# Patient Record
Sex: Male | Born: 1940 | Hispanic: Refuse to answer | Marital: Married | Smoking: Former smoker
Health system: Southern US, Community
[De-identification: ages and names within clinical notes are randomized; demographics above are authoritative.]

## PROBLEM LIST (undated history)

## (undated) DIAGNOSIS — I10 Essential (primary) hypertension: Secondary | ICD-10-CM

## (undated) DIAGNOSIS — E559 Vitamin D deficiency, unspecified: Secondary | ICD-10-CM

## (undated) DIAGNOSIS — I2510 Atherosclerotic heart disease of native coronary artery without angina pectoris: Secondary | ICD-10-CM

## (undated) DIAGNOSIS — N478 Other disorders of prepuce: Secondary | ICD-10-CM

## (undated) DIAGNOSIS — M24039 Loose body in unspecified wrist: Secondary | ICD-10-CM

## (undated) DIAGNOSIS — K4090 Unilateral inguinal hernia, without obstruction or gangrene, not specified as recurrent: Secondary | ICD-10-CM

## (undated) DIAGNOSIS — N471 Phimosis: Secondary | ICD-10-CM

## (undated) DIAGNOSIS — E785 Hyperlipidemia, unspecified: Secondary | ICD-10-CM

## (undated) DIAGNOSIS — E8881 Metabolic syndrome: Secondary | ICD-10-CM

## (undated) DIAGNOSIS — R0989 Other specified symptoms and signs involving the circulatory and respiratory systems: Secondary | ICD-10-CM

## (undated) DIAGNOSIS — E119 Type 2 diabetes mellitus without complications: Secondary | ICD-10-CM

## (undated) DIAGNOSIS — M1990 Unspecified osteoarthritis, unspecified site: Secondary | ICD-10-CM

## (undated) DIAGNOSIS — N400 Enlarged prostate without lower urinary tract symptoms: Secondary | ICD-10-CM

## (undated) DIAGNOSIS — M810 Age-related osteoporosis without current pathological fracture: Secondary | ICD-10-CM

## (undated) DIAGNOSIS — E291 Testicular hypofunction: Secondary | ICD-10-CM

## (undated) DIAGNOSIS — G8929 Other chronic pain: Secondary | ICD-10-CM

## (undated) DIAGNOSIS — D649 Anemia, unspecified: Secondary | ICD-10-CM

## (undated) HISTORY — DX: Other disorders of prepuce: N47.8

## (undated) HISTORY — DX: Phimosis: N47.1

## (undated) HISTORY — DX: Other specified symptoms and signs involving the circulatory and respiratory systems: R09.89

## (undated) HISTORY — DX: Loose body in unspecified wrist: M24.039

## (undated) HISTORY — DX: Type 2 diabetes mellitus without complications: E11.9

## (undated) HISTORY — DX: Vitamin D deficiency, unspecified: E55.9

## (undated) HISTORY — PX: HERNIA REPAIR: SHX51

## (undated) HISTORY — DX: Anemia, unspecified: D64.9

## (undated) HISTORY — DX: Hyperlipidemia, unspecified: E78.5

## (undated) HISTORY — DX: Metabolic syndrome: E88.81

## (undated) HISTORY — DX: Testicular hypofunction: E29.1

## (undated) HISTORY — DX: Unspecified osteoarthritis, unspecified site: M19.90

## (undated) HISTORY — DX: Essential (primary) hypertension: I10

## (undated) HISTORY — DX: Age-related osteoporosis without current pathological fracture: M81.0

## (undated) HISTORY — PX: CATARACT EXTRACTION, BILATERAL: SHX1313

## (undated) HISTORY — DX: Atherosclerotic heart disease of native coronary artery without angina pectoris: I25.10

## (undated) HISTORY — DX: Unilateral inguinal hernia, without obstruction or gangrene, not specified as recurrent: K40.90

## (undated) HISTORY — DX: Other chronic pain: G89.29

## (undated) HISTORY — DX: Benign prostatic hyperplasia without lower urinary tract symptoms: N40.0

## (undated) HISTORY — DX: Metabolic syndrome: E88.810

---

## 2009-02-18 HISTORY — PX: CIRCUMCISION: SUR203

## 2009-07-04 ENCOUNTER — Ambulatory Visit: Payer: Self-pay | Admitting: Urology

## 2009-07-07 ENCOUNTER — Ambulatory Visit: Payer: Self-pay | Admitting: Cardiovascular Disease

## 2009-07-19 ENCOUNTER — Ambulatory Visit: Payer: Self-pay | Admitting: Cardiology

## 2009-07-19 HISTORY — PX: CORONARY STENT PLACEMENT: SHX1402

## 2009-11-03 ENCOUNTER — Ambulatory Visit: Payer: Self-pay | Admitting: Urology

## 2009-11-14 ENCOUNTER — Ambulatory Visit: Payer: Self-pay | Admitting: Urology

## 2009-11-15 LAB — PATHOLOGY REPORT

## 2011-06-12 LAB — COMPREHENSIVE METABOLIC PANEL
Alkaline Phosphatase: 39 U/L
Anion Gap: 6 mmol/L (ref ?–30)
Calcium: 9.2 mg/dL
Cholesterol: 134 mg/dL (ref 0–200)
Potassium: 4.8 mmol/L
Sodium: 136 mmol/L — AB (ref 137–147)
Total Bilirubin: 0.2 mg/dL
Total Protein: 6.9 g/dL

## 2011-06-12 LAB — CBC WITH DIFFERENTIAL/PLATELET
Hemoglobin: 9.2 g/dL — AB (ref 13.5–17.5)
WBC: 5.6
platelet count: 333

## 2011-08-02 ENCOUNTER — Other Ambulatory Visit: Payer: Self-pay | Admitting: Internal Medicine

## 2011-08-02 ENCOUNTER — Encounter: Payer: Self-pay | Admitting: Urgent Care

## 2011-08-02 ENCOUNTER — Ambulatory Visit (INDEPENDENT_AMBULATORY_CARE_PROVIDER_SITE_OTHER): Payer: Medicare Other | Admitting: Urgent Care

## 2011-08-02 VITALS — BP 119/55 | HR 72 | Temp 97.4°F | Ht 70.0 in | Wt 193.0 lb

## 2011-08-02 DIAGNOSIS — R195 Other fecal abnormalities: Secondary | ICD-10-CM

## 2011-08-02 DIAGNOSIS — D649 Anemia, unspecified: Secondary | ICD-10-CM | POA: Insufficient documentation

## 2011-08-02 DIAGNOSIS — K921 Melena: Secondary | ICD-10-CM

## 2011-08-02 DIAGNOSIS — K219 Gastro-esophageal reflux disease without esophagitis: Secondary | ICD-10-CM

## 2011-08-02 MED ORDER — PEG 3350-KCL-NA BICARB-NACL 420 G PO SOLR: ORAL | Status: AC

## 2011-08-02 NOTE — Assessment & Plan Note (Signed)
Rare symptoms, responds to prn TUMs.  May need PPI,  Await EGD findings for anemia.

## 2011-08-02 NOTE — Assessment & Plan Note (Addendum)
Kent Porter is a pleasant 71 y.o. male w/ new diagnosis of normocytic anemia & scant hematochezia in setting of aspirin & plavix for CAD/stent.  Hgb 9.2.   Colonoscopy to look for source including colorectal ca, polyp, or benign source.  EGD at the same time due to GERD symptoms & anemia to r/o upper GI source such as PUD or gastritis.  We did discuss the fact that he is at a slightly higher risk of GI bleeding given the fact that he is on ASA/plavix, however the benefits of remaining on the meds outweigh the risk of bleeding. We discussed the life threatening nature of an embolic event. He agrees to remain on ASA/plavix for this procedure. He also understands that a second procedure may be required since he is on the meds if he shows signs of significant bleeding or is in need of significant intervention that cannot be performed while on ASA/plavix. Other risks discussed include reaction to the medication, perforation, and infection. He agrees with all the above and consent will be obtained.   Hold your iron for 1 week prior to the procedures Hold morning dose of metformin day of procedure until after procedure TO ER if you develop chest pain, bleeding, dizziness, heart racing or trouble breathing.

## 2011-08-02 NOTE — Patient Instructions (Addendum)
Colonoscopy & EGD (upper endoscopy) with Dr Jena Gauss Hold your iron for 1 week prior to the procedures Hold morning dose of metformin day of procedure until after procedure TO ER if you develop chest pain, bleeding, dizziness, heart racing or trouble breathing.

## 2011-08-02 NOTE — Assessment & Plan Note (Signed)
See anemia ?

## 2011-08-02 NOTE — Progress Notes (Signed)
Referring Provider: Dr. Sakeitha Crowder ([place], Fax:[phone]) Primary Care Physician: Dr Crowder Primary Gastroenterologist:  Dr. Rourk  Chief Complaint  Patient presents with  . Colonoscopy  . Anemia   HPI:  Kent Porter is a 70 y.o. male here as a referral from Dr. S. Crowder for anemia & colonoscopy.  Pt states he had fatigue & some SOBOE.  He has been sitting down a lot.  He went to his PCP & also had a stress test & everything was normal.  Dx w/ anemia.  Hgb 9.2, Hct 27.8.  MCV 79.4.  On ASA x 15 yrs & plavix x 3 years for CAD s/p stent. He has rare heartburn 3-4 times per mo, may take a rolaids if he takes anything.   Denies nausea, vomiting, dysphagia, odynophagia or anorexia.   Chronic constipation with all the meds he takes.  He has seen rare episodes of bright red blood in small amounts in commode w/ straining.  Weight stable.  Started iron 1 month ago.  No hx blood transfusion or anemia previously.  +easy bruising.  1 episode epistaxis 8 weeks ago for several hours.  No other NSAIDS.   WBC, platelets normal BUN 37, glucose 127, otherwise normal CMP  Past Medical History  Diagnosis Date  . HTN (hypertension)   . CAD (coronary artery disease)   . DM (diabetes mellitus)   . Hyperlipidemia   . OA (osteoarthritis)   . Inguinal hernia     left  . Redundant prepuce and phimosis   . DJD (degenerative joint disease)   . Dysmetabolic syndrome X   . Loose body in forearm joint   . Osteoporosis   . Hypogonadism male   . Carotid bruit   . Prostate hypertrophy   . Chronic pain   . Vitamin D deficiency disease   . Anemia     Past Surgical History  Procedure Date  . Cataract extraction, bilateral     bilat  . Hernia repair     bilat inguinal  . Coronary stent placement 07/19/2009    Dr Paraschos  . Circumcision 2011    Current Outpatient Prescriptions  Medication Sig Dispense Refill  . aspirin 81 MG tablet Take 81 mg by mouth daily.      . atorvastatin (LIPITOR) 40  MG tablet Take 40 mg by mouth daily.      . calcium carbonate 200 MG capsule Take 600 mg by mouth 3 (three) times daily.       . Cholecalciferol (VITAMIN D3) 1000 UNITS CAPS Take 1,000 Units by mouth 2 (two) times daily.       . clopidogrel (PLAVIX) 75 MG tablet Take 75 mg by mouth daily.      . docusate sodium (COLACE) 100 MG capsule Take 100 mg by mouth 3 (three) times daily as needed.       . DULoxetine (CYMBALTA) 60 MG capsule Take 60 mg by mouth daily.      . fenofibrate 160 MG tablet Take 160 mg by mouth daily.      . Ferrous Sulfate (IRON) 325 (65 FE) MG TABS Take 1 tablet by mouth 2 (two) times daily.      . lisinopril (PRINIVIL,ZESTRIL) 5 MG tablet Take 5 mg by mouth daily.      . metFORMIN (GLUCOPHAGE) 500 MG tablet Take 500 mg by mouth daily with breakfast.      . temazepam (RESTORIL) 15 MG capsule Take 15 mg by mouth at bedtime as needed.      .   polyethylene glycol-electrolytes (TRILYTE) 420 G solution Use as directed Also buy 1 fleet enema & 4 dulcolax tablets to use as directed  4000 mL  0    Allergies as of 08/02/2011  . (No Known Allergies)    Family History  Problem Relation Age of Onset  . Prostate cancer Father   . Alzheimer's disease Mother     History   Social History  . Marital Status: Married    Spouse Name: N/A    Number of Children: 2  . Years of Education: N/A   Occupational History  . Saw mill mechanic    Social History Main Topics  . Smoking status: Former Smoker -- 1.0 packs/day    Types: Cigarettes    Quit date: 08/01/1981  . Smokeless tobacco: Not on file   Comment: quit about 30 yrs ago  . Alcohol Use: Yes     beer couple times per yr  . Drug Use: No  . Sexually Active: Not on file   Other Topics Concern  . Not on file   Social History Narrative   Lives w/ wife    Review of Systems: Gen: Denies any fever, chills, sweats, anorexia, fatigue, weakness, malaise, weight loss, and sleep disorder CV: Denies chest pain, angina,  palpitations, syncope, orthopnea, PND, peripheral edema, and claudication. Resp: Denies dyspnea at rest, dyspnea with exercise, cough, sputum, wheezing, coughing up blood, and pleurisy. GI: Denies vomiting blood, jaundice, and fecal incontinence. GU : Denies urinary burning, blood in urine, urinary frequency, urinary hesitancy, nocturnal urination, and urinary incontinence. MS: Denies joint pain, limitation of movement, and swelling, stiffness, low back pain, extremity pain. Denies muscle weakness, cramps, atrophy.  Derm: Denies rash, itching, dry skin, hives, moles, warts, or unhealing ulcers.  Psych: Denies depression, anxiety, memory loss, suicidal ideation, hallucinations, paranoia, and confusion. Heme: Denies enlarged lymph nodes. Neuro:  Denies any headaches, dizziness, paresthesias. Endo:  Denies any problems with DM, thyroid, adrenal function.  Physical Exam: BP 119/55  Pulse 72  Temp 97.4 F (36.3 C) (Temporal)  Ht 5' 10" (1.778 m)  Wt 193 lb (87.544 kg)  BMI 27.69 kg/m2 General:   Alert,  Well-developed, well-nourished, pleasant and cooperative in NAD Head:  Normocephalic and atraumatic. Eyes:  Sclera clear, no icterus.   Conjunctiva pink. Ears:  Normal auditory acuity. Nose:  No deformity, discharge, or lesions. Mouth:  No deformity or lesions,oropharynx pink & moist. Neck:  Supple; no masses or thyromegaly. Lungs:  Clear throughout to auscultation.   No wheezes, crackles, or rhonchi. No acute distress. Heart:  Regular rate and rhythm; no murmurs, clicks, rubs,  or gallops. Abdomen:  Normal bowel sounds.  No bruits.  Soft, non-tender and non-distended without masses, hepatosplenomegaly or hernias noted.  No guarding or rebound tenderness.   Rectal:  Deferred. Msk:  Symmetrical without gross deformities. Normal posture. Pulses:  Normal pulses noted. Extremities:  No clubbing or edema. Neurologic:  Alert and oriented x4;  grossly normal neurologically. Skin:  Intact without  significant lesions or rashes. Lymph Nodes:  No significant cervical adenopathy. Psych:  Alert and cooperative. Normal mood and affect.  

## 2011-08-05 NOTE — Progress Notes (Signed)
Quick Note:  Reviewed at OV ______ 

## 2011-08-08 NOTE — Progress Notes (Signed)
No PCP on file 

## 2011-08-09 MED ORDER — SODIUM CHLORIDE 0.45 % IV SOLN
Freq: Once | INTRAVENOUS | Status: AC
  Administered 2011-08-12: 13:00:00 via INTRAVENOUS

## 2011-08-12 ENCOUNTER — Encounter (HOSPITAL_COMMUNITY)
Admission: RE | Disposition: A | Discharge status: Home / Self Care | Payer: Self-pay | Source: Ambulatory Visit | Attending: Internal Medicine

## 2011-08-12 ENCOUNTER — Ambulatory Visit (HOSPITAL_COMMUNITY)
Admission: RE | Admit: 2011-08-12 | Discharge: 2011-08-12 | Disposition: A | Discharge status: Home / Self Care | Payer: Medicare Other | Source: Ambulatory Visit | Attending: Internal Medicine | Admitting: Internal Medicine

## 2011-08-12 ENCOUNTER — Encounter (HOSPITAL_COMMUNITY): Payer: Self-pay

## 2011-08-12 DIAGNOSIS — K2960 Other gastritis without bleeding: Secondary | ICD-10-CM

## 2011-08-12 DIAGNOSIS — E785 Hyperlipidemia, unspecified: Secondary | ICD-10-CM | POA: Insufficient documentation

## 2011-08-12 DIAGNOSIS — Z79899 Other long term (current) drug therapy: Secondary | ICD-10-CM | POA: Insufficient documentation

## 2011-08-12 DIAGNOSIS — D126 Benign neoplasm of colon, unspecified: Secondary | ICD-10-CM | POA: Insufficient documentation

## 2011-08-12 DIAGNOSIS — K2100 Gastro-esophageal reflux disease with esophagitis, without bleeding: Secondary | ICD-10-CM | POA: Insufficient documentation

## 2011-08-12 DIAGNOSIS — D509 Iron deficiency anemia, unspecified: Secondary | ICD-10-CM

## 2011-08-12 DIAGNOSIS — E119 Type 2 diabetes mellitus without complications: Secondary | ICD-10-CM | POA: Insufficient documentation

## 2011-08-12 DIAGNOSIS — K5730 Diverticulosis of large intestine without perforation or abscess without bleeding: Secondary | ICD-10-CM

## 2011-08-12 DIAGNOSIS — R195 Other fecal abnormalities: Secondary | ICD-10-CM

## 2011-08-12 DIAGNOSIS — K921 Melena: Secondary | ICD-10-CM | POA: Insufficient documentation

## 2011-08-12 DIAGNOSIS — K644 Residual hemorrhoidal skin tags: Secondary | ICD-10-CM | POA: Insufficient documentation

## 2011-08-12 DIAGNOSIS — I10 Essential (primary) hypertension: Secondary | ICD-10-CM | POA: Insufficient documentation

## 2011-08-12 DIAGNOSIS — D649 Anemia, unspecified: Secondary | ICD-10-CM

## 2011-08-12 DIAGNOSIS — Z01812 Encounter for preprocedural laboratory examination: Secondary | ICD-10-CM | POA: Insufficient documentation

## 2011-08-12 SURGERY — COLONOSCOPY WITH ESOPHAGOGASTRODUODENOSCOPY (EGD)
Anesthesia: Moderate Sedation

## 2011-08-12 MED ORDER — MIDAZOLAM HCL 5 MG/5ML IJ SOLN
INTRAMUSCULAR | Status: AC
  Filled 2011-08-12: qty 10

## 2011-08-12 MED ORDER — BUTAMBEN-TETRACAINE-BENZOCAINE 2-2-14 % EX AERO
INHALATION_SPRAY | CUTANEOUS | Status: DC | PRN
  Administered 2011-08-12: 2 via TOPICAL

## 2011-08-12 MED ORDER — STERILE WATER FOR IRRIGATION IR SOLN
Status: DC | PRN
  Administered 2011-08-12: 14:00:00

## 2011-08-12 MED ORDER — MEPERIDINE HCL 100 MG/ML IJ SOLN
INTRAMUSCULAR | Status: DC | PRN
  Administered 2011-08-12 (×2): 25 mg via INTRAVENOUS
  Administered 2011-08-12: 50 mg via INTRAVENOUS

## 2011-08-12 MED ORDER — MEPERIDINE HCL 100 MG/ML IJ SOLN
INTRAMUSCULAR | Status: AC
  Filled 2011-08-12: qty 2

## 2011-08-12 MED ORDER — MIDAZOLAM HCL 5 MG/5ML IJ SOLN
INTRAMUSCULAR | Status: DC | PRN
  Administered 2011-08-12: 1 mg via INTRAVENOUS
  Administered 2011-08-12: 2 mg via INTRAVENOUS
  Administered 2011-08-12 (×2): 1 mg via INTRAVENOUS

## 2011-08-12 NOTE — Discharge Instructions (Addendum)
EGD Discharge instructions Please read the instructions outlined below and refer to this sheet in the next few weeks. These discharge instructions provide you with general information on caring for yourself after you leave the hospital. Your doctor may also give you specific instructions. While your treatment has been planned according to the most current medical practices available, unavoidable complications occasionally occur. If you have any problems or questions after discharge, please call your doctor. ACTIVITY  You may resume your regular activity but move at a slower pace for the next 24 hours.   Take frequent rest periods for the next 24 hours.   Walking will help expel (get rid of) the air and reduce the bloated feeling in your abdomen.   No driving for 24 hours (because of the anesthesia (medicine) used during the test).   You may shower.   Do not sign any important legal documents or operate any machinery for 24 hours (because of the anesthesia used during the test).  NUTRITION  Drink plenty of fluids.   You may resume your normal diet.   Begin with a light meal and progress to your normal diet.   Avoid alcoholic beverages for 24 hours or as instructed by your caregiver.  MEDICATIONS  You may resume your normal medications unless your caregiver tells you otherwise.  WHAT YOU CAN EXPECT TODAY  You may experience abdominal discomfort such as a feeling of fullness or "gas" pains.  FOLLOW-UP  Your doctor will discuss the results of your test with you.  SEEK IMMEDIATE MEDICAL ATTENTION IF ANY OF THE FOLLOWING OCCUR:  Excessive nausea (feeling sick to your stomach) and/or vomiting.   Severe abdominal pain and distention (swelling).   Trouble swallowing.   Temperature over 101 F (37.8 C).   Rectal bleeding or vomiting of blood.   Colonoscopy Discharge Instructions  Read the instructions outlined below and refer to this sheet in the next few weeks. These  discharge instructions provide you with general information on caring for yourself after you leave the hospital. Your doctor may also give you specific instructions. While your treatment has been planned according to the most current medical practices available, unavoidable complications occasionally occur. If you have any problems or questions after discharge, call Dr. Gala Romney at [phone]. ACTIVITY  You may resume your regular activity, but move at a slower pace for the next 24 hours.   Take frequent rest periods for the next 24 hours.   Walking will help get rid of the air and reduce the bloated feeling in your belly (abdomen).   No driving for 24 hours (because of the medicine (anesthesia) used during the test).    Do not sign any important legal documents or operate any machinery for 24 hours (because of the anesthesia used during the test).  NUTRITION  Drink plenty of fluids.   You may resume your normal diet as instructed by your doctor.   Begin with a light meal and progress to your normal diet. Heavy or fried foods are harder to digest and may make you feel sick to your stomach (nauseated).   Avoid alcoholic beverages for 24 hours or as instructed.  MEDICATIONS  You may resume your normal medications unless your doctor tells you otherwise.  WHAT YOU CAN EXPECT TODAY  Some feelings of bloating in the abdomen.   Passage of more gas than usual.   Spotting of blood in your stool or on the toilet paper.  IF YOU HAD POLYPS REMOVED DURING THE COLONOSCOPY:  No aspirin products for 7 days or as instructed.   No alcohol for 7 days or as instructed.   Eat a soft diet for the next 24 hours.  FINDING OUT THE RESULTS OF YOUR TEST Not all test results are available during your visit. If your test results are not back during the visit, make an appointment with your caregiver to find out the results. Do not assume everything is normal if you have not heard from your caregiver or the  medical facility. It is important for you to follow up on all of your test results.  SEEK IMMEDIATE MEDICAL ATTENTION IF:  You have more than a spotting of blood in your stool.   Your belly is swollen (abdominal distention).   You are nauseated or vomiting.   You have a temperature over 101.   You have abdominal pain or discomfort that is severe or gets worse throughout the day.   Polyp,  diverticulosis and GERD information provided  Begin Protonix 40 mg orally daily. Prescription provided.  Further recommendations to follow pending review of pathology report.Colon Polyps A polyp is extra tissue that grows inside your body. Colon polyps grow in the large intestine. The large intestine, also called the colon, is part of your digestive system. It is a long, hollow tube at the end of your digestive tract where your body makes and stores stool. Most polyps are not dangerous. They are benign. This means they are not cancerous. But over time, some types of polyps can turn into cancer. Polyps that are smaller than a pea are usually not harmful. But larger polyps could someday become or may already be cancerous. To be safe, doctors remove all polyps and test them.  WHO GETS POLYPS? Anyone can get polyps, but certain people are more likely than others. You may have a greater chance of getting polyps if:  You are over 50.   You have had polyps before.   Someone in your family has had polyps.   Someone in your family has had cancer of the large intestine.   Find out if someone in your family has had polyps. You may also be more likely to get polyps if you:   Eat a lot of fatty foods.   Smoke.   Drink alcohol.   Do not exercise.   Eat too much.  SYMPTOMS  Most small polyps do not cause symptoms. People often do not know they have one until their caregiver finds it during a regular checkup or while testing them for something else. Some people do have symptoms like these:  Bleeding  from the anus. You might notice blood on your underwear or on toilet paper after you have had a bowel movement.   Constipation or diarrhea that lasts more than a week.   Blood in the stool. Blood can make stool look black or it can show up as red streaks in the stool.  If you have any of these symptoms, see your caregiver. HOW DOES THE DOCTOR TEST FOR POLYPS? The doctor can use four tests to check for polyps:  Digital rectal exam. The caregiver wears gloves and checks your rectum (the last part of the large intestine) to see if it feels normal. This test would find polyps only in the rectum. Your caregiver may need to do one of the other tests listed below to find polyps higher up in the intestine.   Barium enema. The caregiver puts a liquid called barium into your rectum before  taking x-rays of your large intestine. Barium makes your intestine look white in the pictures. Polyps are dark, so they are easy to see.   Sigmoidoscopy. With this test, the caregiver can see inside your large intestine. A thin flexible tube is placed into your rectum. The device is called a sigmoidoscope, which has a light and a tiny video camera in it. The caregiver uses the sigmoidoscope to look at the last third of your large intestine.   Colonoscopy. This test is like sigmoidoscopy, but the caregiver looks at all of the large intestine. It usually requires sedation. This is the most common method for finding and removing polyps.  TREATMENT   The caregiver will remove the polyp during sigmoidoscopy or colonoscopy. The polyp is then tested for cancer.   If you have had polyps, your caregiver may want you to get tested regularly in the future.  PREVENTION  There is not one sure way to prevent polyps. You might be able to lower your risk of getting them if you:  Eat more fruits and vegetables and less fatty food.   Do not smoke.   Avoid alcohol.   Exercise every day.   Lose weight if you are overweight.    Eating more calcium and folate can also lower your risk of getting polyps. Some foods that are rich in calcium are milk, cheese, and broccoli. Some foods that are rich in folate are chickpeas, kidney beans, and spinach.   Aspirin might help prevent polyps. Studies are under way.    Diverticulitis A diverticulum is a small pouch or sac on the colon. Diverticulosis is the presence of these diverticula on the colon. Diverticulitis is the irritation (inflammation) or infection of diverticula. CAUSES  The colon and its diverticula contain bacteria. If food particles block the tiny opening to a diverticulum, the bacteria inside can grow and cause an increase in pressure. This leads to infection and inflammation and is called diverticulitis. SYMPTOMS   Abdominal pain and tenderness. Usually, the pain is located on the left side of your abdomen. However, it could be located elsewhere.   Fever.   Bloating.   Feeling sick to your stomach (nausea).   Throwing up (vomiting).   Abnormal stools.  DIAGNOSIS  Your caregiver will take a history and perform a physical exam. Since many things can cause abdominal pain, other tests may be necessary. Tests may include:  Blood tests.   Urine tests.   X-ray of the abdomen.   CT scan of the abdomen.  Sometimes, surgery is needed to determine if diverticulitis or other conditions are causing your symptoms. TREATMENT  Most of the time, you can be treated without surgery. Treatment includes:  Resting the bowels by only having liquids for a few days. As you improve, you will need to eat a low-fiber diet.   Intravenous (IV) fluids if you are losing body fluids (dehydrated).   Antibiotic medicines that treat infections may be given.   Pain and nausea medicine, if needed.   Surgery if the inflamed diverticulum has burst.  HOME CARE INSTRUCTIONS   Try a clear liquid diet (broth, tea, or water for as long as directed by your caregiver). You may then  gradually begin a low-fiber diet as tolerated. A low-fiber diet is a diet with less than 10 grams of fiber. Choose the foods below to reduce fiber in the diet:   White breads, cereals, rice, and pasta.   Cooked fruits and vegetables or soft fresh fruits and  vegetables without the skin.   Ground or well-cooked tender beef, ham, veal, lamb, pork, or poultry.   Eggs and seafood.   After your diverticulitis symptoms have improved, your caregiver may put you on a high-fiber diet. A high-fiber diet includes 14 grams of fiber for every 1000 calories consumed. For a standard 2000 calorie diet, you would need 28 grams of fiber. Follow these diet guidelines to help you increase the fiber in your diet. It is important to slowly increase the amount fiber in your diet to avoid gas, constipation, and bloating.   Choose whole-grain breads, cereals, pasta, and brown rice.   Choose fresh fruits and vegetables with the skin on. Do not overcook vegetables because the more vegetables are cooked, the more fiber is lost.   Choose more nuts, seeds, legumes, dried peas, beans, and lentils.   Look for food products that have greater than 3 grams of fiber per serving on the Nutrition Facts label.   Take all medicine as directed by your caregiver.   If your caregiver has given you a follow-up appointment, it is very important that you go. Not going could result in lasting (chronic) or permanent injury, pain, and disability. If there is any problem keeping the appointment, call to reschedule.  SEEK MEDICAL CARE IF:   Your pain does not improve.   You have a hard time advancing your diet beyond clear liquids.   Your bowel movements do not return to normal.  SEEK IMMEDIATE MEDICAL CARE IF:   Your pain becomes worse.   You have an oral temperature above 102 F (38.9 C), not controlled by medicine.   You have repeated vomiting.   You have bloody or black, tarry stools.   Symptoms that brought you to your  caregiver become worse or are not getting better.  MAKE SURE YOU:   Understand these instructions.   Will watch your condition.   Will get help right away if you are not doing well or get worse.  Document Released: 11/14/2004 Document Revised: 01/24/2011 Document Reviewed: 03/12/2010 [facility] Patient Information 2012 [place], [place].  Gastroesophageal Reflux Disease, Adult Gastroesophageal reflux disease (GERD) happens when acid from your stomach flows up into the esophagus. When acid comes in contact with the esophagus, the acid causes soreness (inflammation) in the esophagus. Over time, GERD may create small holes (ulcers) in the lining of the esophagus. CAUSES   Increased body weight. This puts pressure on the stomach, making acid rise from the stomach into the esophagus.   Smoking. This increases acid production in the stomach.   Drinking alcohol. This causes decreased pressure in the lower esophageal sphincter (valve or ring of muscle between the esophagus and stomach), allowing acid from the stomach into the esophagus.   Late evening meals and a full stomach. This increases pressure and acid production in the stomach.   A malformed lower esophageal sphincter.  Sometimes, no cause is found. SYMPTOMS   Burning pain in the lower part of the mid-chest behind the breastbone and in the mid-stomach area. This may occur twice a week or more often.   Trouble swallowing.   Sore throat.   Dry cough.   Asthma-like symptoms including chest tightness, shortness of breath, or wheezing.  DIAGNOSIS  Your caregiver may be able to diagnose GERD based on your symptoms. In some cases, X-rays and other tests may be done to check for complications or to check the condition of your stomach and esophagus. TREATMENT  Your caregiver may recommend  over-the-counter or prescription medicines to help decrease acid production. Ask your caregiver before starting or adding any new medicines.  HOME CARE  INSTRUCTIONS   Change the factors that you can control. Ask your caregiver for guidance concerning weight loss, quitting smoking, and alcohol consumption.   Avoid foods and drinks that make your symptoms worse, such as:   Caffeine or alcoholic drinks.   Chocolate.   Peppermint or mint flavorings.   Garlic and onions.   Spicy foods.   Citrus fruits, such as oranges, lemons, or limes.   Tomato-based foods such as sauce, chili, salsa, and pizza.   Fried and fatty foods.   Avoid lying down for the 3 hours prior to your bedtime or prior to taking a nap.   Eat small, frequent meals instead of large meals.   Wear loose-fitting clothing. Do not wear anything tight around your waist that causes pressure on your stomach.   Raise the head of your bed 6 to 8 inches with wood blocks to help you sleep. Extra pillows will not help.   Only take over-the-counter or prescription medicines for pain, discomfort, or fever as directed by your caregiver.   Do not take aspirin, ibuprofen, or other nonsteroidal anti-inflammatory drugs (NSAIDs).  SEEK IMMEDIATE MEDICAL CARE IF:   You have pain in your arms, neck, jaw, teeth, or back.   Your pain increases or changes in intensity or duration.   You develop nausea, vomiting, or sweating (diaphoresis).   You develop shortness of breath, or you faint.   Your vomit is green, yellow, black, or looks like coffee grounds or blood.   Your stool is red, bloody, or black.  These symptoms could be signs of other problems, such as heart disease, gastric bleeding, or esophageal bleeding. MAKE SURE YOU:   Understand these instructions.   Will watch your condition.   Will get help right away if you are not doing well or get worse.  Document Released: 11/14/2004 Document Revised: 01/24/2011 Document Reviewed: 08/24/2010 [facility] Patient Information 2012 [place], [place].

## 2011-08-12 NOTE — Op Note (Signed)
[facility] [address] [place], [place]  [address]  COLONOSCOPY PROCEDURE REPORT  PATIENT:  Kent Porter, Kent Porter  MR#:  [id number] BIRTHDATE:  Aug 31, 1940, 70 yrs. old  GENDER:  male ENDOSCOPIST:  R. Roetta Sessions, MD FACP [facility] REF. BY:          Dr. Dessie Coma PROCEDURE DATE:  08/12/2011 PROCEDURE:  Ileocolonoscopy with snare polypectomy  INDICATIONS:  Hemoccult-positive stool iron deficiency anemia. See EGD report.  INFORMED CONSENT:  The risks, benefits, alternatives and imponderables including but not limited to bleeding, perforation as well as the possibility of a missed lesion have been reviewed. The potential for biopsy, lesion removal, etc. have also been discussed.  Questions have been answered.  All parties agreeable. Please see the history and physical in the medical record for more information.  MEDICATIONS:  Versed 5 mg IV and Demerol 100 mg IV in divided doses.  DESCRIPTION OF PROCEDURE:  After a digital rectal exam was performed, the EG-2990i ([id number]) and EC-[id number] ([id number]) colonoscope was advanced from the anus through the rectum and colon to the area of the cecum, ileocecal valve and appendiceal orifice.  The cecum was deeply intubated.  These structures were well-seen and photographed for the record.  From the level of the cecum and ileocecal valve, the scope was slowly and cautiously withdrawn.  The mucosal surfaces were carefully surveyed utilizing scope tip deflection to facilitate fold flattening as needed.  The scope was pulled down into the rectum where a thorough examination including retroflexion was performed. <<PROCEDUREIMAGES>>  FINDINGS:  Couple of small external hemorrhoids; otherwise normal rectum. Somewhat elongated tortuous colon. Scattered pancolonic diverticulosis. A single 6 mm polyp at the splenic flexure; or myoclonic as per normal. Normal distal 10 cm of terminal ileal mucosa.  THERAPEUTIC / DIAGNOSTIC MANEUVERS PERFORMED:  The polyp at splenic flexure was hot snare removed.  COMPLICATIONS:  None  CECAL WITHDRAWAL TIME:   11 minutes  IMPRESSION:   External hemorrhoids. Pancolonic diverticulosis. Colonic polyp-removed as described above. Findings at colonoscopy today                   would  not explain this degree of anemia via a mechanism of GI bleeding. See EGD report.  RECOMMENDATIONS:    Followup of pathology. Patient may need capsule imaging of the small bowel in the near future.  ______________________________ R. Roetta Sessions, MD Caleen Essex  CC:  n. eSIGNED:   R. Roetta Sessions at 08/12/2011 03:08 PM  Cherylynn Ridges, [id number]

## 2011-08-12 NOTE — H&P (View-Only) (Signed)
Referring Provider: Dr. Dessie Coma [facility], Fax:[phone]) Primary Care Physician: Dr Jaci Lazier Primary Gastroenterologist:  Dr. Jena Gauss  Chief Complaint  Patient presents with  . Colonoscopy  . Anemia   HPI:  Kent Porter is a 71 y.o. male here as a referral from Dr. Jethro Bastos for anemia & colonoscopy.  Pt states he had fatigue & some SOBOE.  He has been sitting down a lot.  He went to his PCP & also had a stress test & everything was normal.  Dx w/ anemia.  Hgb 9.2, Hct 27.8.  MCV 79.4.  On ASA x 15 yrs & plavix x 3 years for CAD s/p stent. He has rare heartburn 3-4 times per mo, may take a rolaids if he takes anything.   Denies nausea, vomiting, dysphagia, odynophagia or anorexia.   Chronic constipation with all the meds he takes.  He has seen rare episodes of bright red blood in small amounts in commode w/ straining.  Weight stable.  Started iron 1 month ago.  No hx blood transfusion or anemia previously.  +easy bruising.  1 episode epistaxis 8 weeks ago for several hours.  No other NSAIDS.   WBC, platelets normal BUN 37, glucose 127, otherwise normal CMP  Past Medical History  Diagnosis Date  . HTN (hypertension)   . CAD (coronary artery disease)   . DM (diabetes mellitus)   . Hyperlipidemia   . OA (osteoarthritis)   . Inguinal hernia     left  . Redundant prepuce and phimosis   . DJD (degenerative joint disease)   . Dysmetabolic syndrome X   . Loose body in forearm joint   . Osteoporosis   . Hypogonadism male   . Carotid bruit   . Prostate hypertrophy   . Chronic pain   . Vitamin D deficiency disease   . Anemia     Past Surgical History  Procedure Date  . Cataract extraction, bilateral     bilat  . Hernia repair     bilat inguinal  . Coronary stent placement 07/19/2009    Dr Darrold Junker  . Circumcision 2011    Current Outpatient Prescriptions  Medication Sig Dispense Refill  . aspirin 81 MG tablet Take 81 mg by mouth daily.      Marland Kitchen atorvastatin (LIPITOR) 40  MG tablet Take 40 mg by mouth daily.      . calcium carbonate 200 MG capsule Take 600 mg by mouth 3 (three) times daily.       . Cholecalciferol (VITAMIN D3) 1000 UNITS CAPS Take 1,000 Units by mouth 2 (two) times daily.       . clopidogrel (PLAVIX) 75 MG tablet Take 75 mg by mouth daily.      Marland Kitchen docusate sodium (COLACE) 100 MG capsule Take 100 mg by mouth 3 (three) times daily as needed.       . DULoxetine (CYMBALTA) 60 MG capsule Take 60 mg by mouth daily.      . fenofibrate 160 MG tablet Take 160 mg by mouth daily.      . Ferrous Sulfate (IRON) 325 (65 FE) MG TABS Take 1 tablet by mouth 2 (two) times daily.      Marland Kitchen lisinopril (PRINIVIL,ZESTRIL) 5 MG tablet Take 5 mg by mouth daily.      . metFORMIN (GLUCOPHAGE) 500 MG tablet Take 500 mg by mouth daily with breakfast.      . temazepam (RESTORIL) 15 MG capsule Take 15 mg by mouth at bedtime as needed.      Marland Kitchen  polyethylene glycol-electrolytes (TRILYTE) 420 G solution Use as directed Also buy 1 fleet enema & 4 dulcolax tablets to use as directed  4000 mL  0    Allergies as of 08/02/2011  . (No Known Allergies)    Family History  Problem Relation Age of Onset  . Prostate cancer Father   . Alzheimer's disease Mother     History   Social History  . Marital Status: Married    Spouse Name: N/A    Number of Children: 2  . Years of Education: N/A   Occupational History  . [employer] adviser    Social History Main Topics  . Smoking status: Former Smoker -- 1.0 packs/day    Types: Cigarettes    Quit date: 08/01/1981  . Smokeless tobacco: Not on file   Comment: quit about 30 yrs ago  . Alcohol Use: Yes     beer couple times per yr  . Drug Use: No  . Sexually Active: Not on file   Other Topics Concern  . Not on file   Social History Narrative   Lives w/ wife    Review of Systems: Gen: Denies any fever, chills, sweats, anorexia, fatigue, weakness, malaise, weight loss, and sleep disorder CV: Denies chest pain, angina,  palpitations, syncope, orthopnea, PND, peripheral edema, and claudication. Resp: Denies dyspnea at rest, dyspnea with exercise, cough, sputum, wheezing, coughing up blood, and pleurisy. GI: Denies vomiting blood, jaundice, and fecal incontinence. GU : Denies urinary burning, blood in urine, urinary frequency, urinary hesitancy, nocturnal urination, and urinary incontinence. MS: Denies joint pain, limitation of movement, and swelling, stiffness, low back pain, extremity pain. Denies muscle weakness, cramps, atrophy.  Derm: Denies rash, itching, dry skin, hives, moles, warts, or unhealing ulcers.  Psych: Denies depression, anxiety, memory loss, suicidal ideation, hallucinations, paranoia, and confusion. Heme: Denies enlarged lymph nodes. Neuro:  Denies any headaches, dizziness, paresthesias. Endo:  Denies any problems with DM, thyroid, adrenal function.  Physical Exam: BP 119/55  Pulse 72  Temp 97.4 F (36.3 C) (Temporal)  Ht 5\' 10"  (1.778 m)  Wt 193 lb (87.544 kg)  BMI 27.69 kg/m2 General:   Alert,  Well-developed, well-nourished, pleasant and cooperative in NAD Head:  Normocephalic and atraumatic. Eyes:  Sclera clear, no icterus.   Conjunctiva pink. Ears:  Normal auditory acuity. Nose:  No deformity, discharge, or lesions. Mouth:  No deformity or lesions,oropharynx pink & moist. Neck:  Supple; no masses or thyromegaly. Lungs:  Clear throughout to auscultation.   No wheezes, crackles, or rhonchi. No acute distress. Heart:  Regular rate and rhythm; no murmurs, clicks, rubs,  or gallops. Abdomen:  Normal bowel sounds.  No bruits.  Soft, non-tender and non-distended without masses, hepatosplenomegaly or hernias noted.  No guarding or rebound tenderness.   Rectal:  Deferred. Msk:  Symmetrical without gross deformities. Normal posture. Pulses:  Normal pulses noted. Extremities:  No clubbing or edema. Neurologic:  Alert and oriented x4;  grossly normal neurologically. Skin:  Intact without  significant lesions or rashes. Lymph Nodes:  No significant cervical adenopathy. Psych:  Alert and cooperative. Normal mood and affect.

## 2011-08-12 NOTE — Op Note (Signed)
[facility] [address] [place], [place]  [address]  ENDOSCOPY PROCEDURE REPORT  PATIENT:  Kent Porter, Kent Porter  MR#:  [id number] BIRTHDATE:  October 19, 1940, 70 yrs. old  GENDER:  male  ENDOSCOPIST:  R. Roetta Sessions, MD FACP [facility] Referred by:          Dr. Dessie Coma  PROCEDURE DATE:  08/12/2011 PROCEDURE:  EGD with gastric biopsy  INDICATIONS:   frequent GERD; iron deficiency anemia.  INFORMED CONSENT:   The risks, benefits, limitations, alternatives and imponderables have been discussed.  The potential for biopsy, esophogeal dilation, etc. have also been reviewed.  Questions have been answered.  All parties agreeable.  Please see the history and physical in the medical record for more information.  MEDICATIONS:     Versed 4 mg IV and Demerol 50 mg IV in divided doses. Cetacaine spray.  DESCRIPTION OF PROCEDURE:   The EG-2990i ([id number]) endoscope was introduced through the mouth and advanced to the second portion of the duodenum without difficulty or limitations.  The mucosal surfaces were surveyed very carefully during advancement of the scope and upon withdrawal.  Retroflexion view of the proximal stomach and esophagogastric junction was performed.  <<PROCEDUREIMAGES>>  FINDINGS:    4 quadrant distal esophageal erosions within 5 mm of the GE junction. No Barrett's esophagus. Somewhat patulous EG junction. Stomach empty. Small hiatal hernia. Numerous antral and body erosions. No ulcer or infiltrating process. Pylorus     patent. Bulbar erosions; otherwise the first and second portion of the duodenum appeared normal.  THERAPEUTIC / DIAGNOSTIC MANEUVERS PERFORMED:   Biopsies of the gastric because taken for histologic study  COMPLICATIONS:   None  IMPRESSION: Erosive reflux esophagitis. Small hilar hernia. Gastric and duodenal bulbar erosions-status gastric post biopsy  RECOMMENDATIONS:    Follow up on pathology. Begin a Protonix. 40 mg orally daily. GERD  information provided. See colonoscopy report.  ______________________________ R. Roetta Sessions, MD Caleen Essex  CC:  n. eSIGNED:   R. Roetta Sessions at 08/12/2011 02:40 PM  Cherylynn Ridges, [id number]

## 2011-08-12 NOTE — Interval H&P Note (Signed)
History and Physical Interval Note:  08/12/2011 2:13 PM  Kent Porter  has presented today for surgery, with the diagnosis of ANEMIA, BLOOD IN STOOL  The various methods of treatment have been discussed with the patient and family. After consideration of risks, benefits and other options for treatment, the patient has consented to  Procedure(s) (LRB): COLONOSCOPY WITH ESOPHAGOGASTRODUODENOSCOPY (EGD) (N/A) as a surgical intervention .  The patient's history has been reviewed, patient examined, no change in status, stable for surgery.  I have reviewed the patients' chart and labs.  Questions were answered to the patient's satisfaction.     [name]

## 2011-08-15 ENCOUNTER — Other Ambulatory Visit: Payer: Self-pay | Admitting: Internal Medicine

## 2011-08-15 ENCOUNTER — Encounter: Payer: Self-pay | Admitting: Internal Medicine

## 2011-08-15 NOTE — Progress Notes (Unsigned)
  Kent Porter, please schedule capsule study.   Letter from: Corbin Ade Reason for Letter: Results Review Send letter to patient.  Send copy of letter with path to referring provider and PCP. Please schedule capsule study iron deficiency anemia and Hemoccult-positive stool

## 2011-08-15 NOTE — Progress Notes (Signed)
Patient is scheduled for a [facility] Capsule Study on July 8th at 7:30 AM instructions have been mailed to the patient and the patient is aware

## 2011-08-16 ENCOUNTER — Encounter (HOSPITAL_COMMUNITY): Payer: Self-pay | Admitting: Pharmacy Technician

## 2011-08-17 ENCOUNTER — Inpatient Hospital Stay: Payer: Self-pay | Admitting: Internal Medicine

## 2011-08-17 LAB — COMPREHENSIVE METABOLIC PANEL
Albumin: 3.1 g/dL — ABNORMAL LOW (ref 3.4–5.0)
BUN: 34 mg/dL — ABNORMAL HIGH (ref 7–18)
Calcium, Total: 7.9 mg/dL — ABNORMAL LOW (ref 8.5–10.1)
Co2: 23 mmol/L (ref 21–32)
Creatinine: 1.21 mg/dL (ref 0.60–1.30)
EGFR (African American): >60
EGFR (Non-African Amer.): >60
Glucose: 171 mg/dL — ABNORMAL HIGH (ref 65–99)
Osmolality: 289 (ref 275–301)
Potassium: 4.4 mmol/L (ref 3.5–5.1)
Total Protein: 5.3 g/dL — ABNORMAL LOW (ref 6.4–8.2)

## 2011-08-17 LAB — CBC
HCT: 24.7 % — ABNORMAL LOW (ref 40.0–52.0)
HGB: 7.8 g/dL — ABNORMAL LOW (ref 13.0–18.0)
MCH: 26.9 pg (ref 26.0–34.0)
MCHC: 31.8 g/dL — ABNORMAL LOW (ref 32.0–36.0)
MCV: 85 fL (ref 80–100)
RBC: 2.92 10*6/uL — ABNORMAL LOW (ref 4.40–5.90)
RDW: 21.5 % — ABNORMAL HIGH (ref 11.5–14.5)

## 2011-08-17 LAB — TROPONIN I: Troponin-I: 0.03 ng/mL

## 2011-08-18 LAB — HEMOGLOBIN A1C: Hemoglobin A1C: 5.2 % (ref 4.2–6.3)

## 2011-08-18 LAB — CBC WITH DIFFERENTIAL/PLATELET
Basophil %: 0.5 %
Eosinophil #: 0.1 10*3/uL (ref 0.0–0.7)
HCT: 22.8 % — ABNORMAL LOW (ref 40.0–52.0)
HGB: 8.9 g/dL — ABNORMAL LOW (ref 13.0–18.0)
Lymphocyte #: 1.5 10*3/uL (ref 1.0–3.6)
MCH: 28.4 pg (ref 26.0–34.0)
MCHC: 33.1 g/dL (ref 32.0–36.0)
MCV: 87 fL (ref 80–100)
Monocyte #: 0.7 x10 3/mm (ref 0.2–1.0)
Monocyte #: 0.8 x10 3/mm (ref 0.2–1.0)
Monocyte %: 11.5 %
Neutrophil %: 62.6 %
Platelet: 193 10*3/uL (ref 150–440)
Platelet: 203 10*3/uL (ref 150–440)
RBC: 2.65 10*6/uL — ABNORMAL LOW (ref 4.40–5.90)
RBC: 3.13 10*6/uL — ABNORMAL LOW (ref 4.40–5.90)
RDW: 20.4 % — ABNORMAL HIGH (ref 11.5–14.5)
RDW: 19.6 % — ABNORMAL HIGH (ref 11.5–14.5)

## 2011-08-18 LAB — BASIC METABOLIC PANEL
Anion Gap: 8 (ref 7–16)
BUN: 28 mg/dL — ABNORMAL HIGH (ref 7–18)
Calcium, Total: 7.5 mg/dL — ABNORMAL LOW (ref 8.5–10.1)
Creatinine: 1.01 mg/dL (ref 0.60–1.30)
EGFR (African American): >60
Glucose: 110 mg/dL — ABNORMAL HIGH (ref 65–99)
Osmolality: 289 (ref 275–301)

## 2011-08-18 LAB — LIPID PANEL: HDL Cholesterol: 29 mg/dL — ABNORMAL LOW (ref 40–60)

## 2011-08-19 LAB — CBC WITH DIFFERENTIAL/PLATELET
Basophil %: 0.5 %
Eosinophil %: 2.4 %
HGB: 8.3 g/dL — ABNORMAL LOW (ref 13.0–18.0)
Lymphocyte #: 1.5 10*3/uL (ref 1.0–3.6)
MCH: 28.4 pg (ref 26.0–34.0)
MCV: 87 fL (ref 80–100)
Monocyte #: 0.9 x10 3/mm (ref 0.2–1.0)
Platelet: 188 10*3/uL (ref 150–440)
RBC: 2.91 10*6/uL — ABNORMAL LOW (ref 4.40–5.90)

## 2011-08-20 ENCOUNTER — Telehealth: Payer: Self-pay | Admitting: Internal Medicine

## 2011-08-20 NOTE — Telephone Encounter (Signed)
Pt called to say that he has a capsule study scheduled at [facility]. He had been admitted there for major bleeding and needs to cancel the capsule study with [place].

## 2011-08-20 NOTE — Telephone Encounter (Signed)
[facility] CAPSULE STUDY HAS BEEN CANCELED

## 2011-08-26 ENCOUNTER — Ambulatory Visit: Payer: Self-pay | Admitting: Gastroenterology

## 2011-08-26 ENCOUNTER — Ambulatory Visit (HOSPITAL_COMMUNITY): Admission: RE | Admit: 2011-08-26 | Payer: Medicare Other | Source: Ambulatory Visit | Admitting: Internal Medicine

## 2011-08-26 ENCOUNTER — Encounter (HOSPITAL_COMMUNITY): Admission: RE | Payer: Self-pay | Source: Ambulatory Visit

## 2011-08-26 SURGERY — IMAGING PROCEDURE, GI TRACT, INTRALUMINAL, VIA CAPSULE

## 2013-05-13 IMAGING — NM NUCLEAR MEDICINE GASTROINTESTINAL BLEEDING STUDY
2 series · 10 of 10 positions shown · non-contrast
Comparison: none

REASON FOR EXAM: lower gi bleed
COMMENTS:

PROCEDURE:     NM  - NM GI BLOOD LOSS STUDY  - August 17, 2011  [DATE]
RESULT:     Comparison: None.
Radiopharmaceutical: 24.279 mCi technetium 99 M pertechnetate.
TECHNIQUE: Standard departmental tagged red cell scan was performed with
monitoring by planar images for 60 minutes. At the completion of the study,
post void frontal and lateral projections were obtained.

[Series 1000: gi bleed · 4.80mm/px · 6 of 250 frames shown]
[frame 21/250]
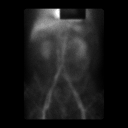
[frame 63/250]
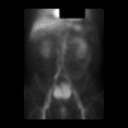
[frame 105/250]
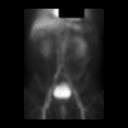
[frame 146/250]
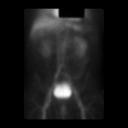
[frame 188/250]
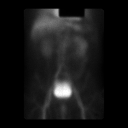
[frame 230/250]
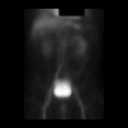

[Series 1000: gi bleed static · 2.40mm/px · 2 acquisitions, 4 frames shown]
[im 1/2]
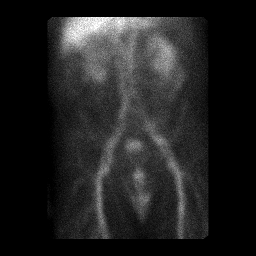
[im 1/2]
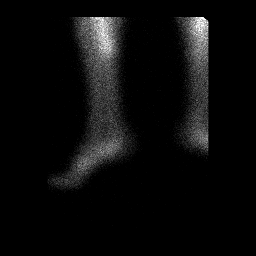
[im 2/2]
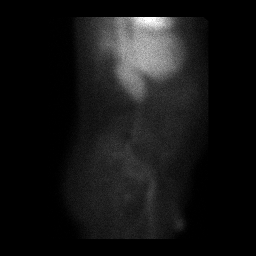
[im 2/2]
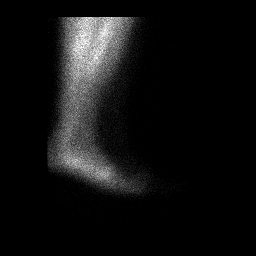

[10 of 10 positions shown; findings below may reference images not displayed]

FINDINGS: There is normal radiotracer distribution in the vascular system, kidneys,
liver, and spleen. On the initial images, there is a curvilinear area of
slightly increased radiotracer activity in the left hemiabdomen which
persists unchanged throughout the remainder of the study. This is of
uncertain etiology, possibly an area of nonspecific inflammation. There are
no areas of peristalsing radiotracer activity to suggest active hemorrhage
into the bowel.
IMPRESSION: No scintigraphic evidence of active hemorrhage into the bowel.

[REDACTED]

## 2013-09-18 DEATH — deceased

## 2014-06-12 NOTE — H&P (Signed)
PATIENT NAME:  Kent Porter, HOAGLIN MR#:  [id number] DATE OF BIRTH:  September 16, 1940  DATE OF ADMISSION:  08/17/2011  PRIMARY CARE PHYSICIAN: Nonlocal.   REFERRING PHYSICIAN: Dr. Ulice Brilliant.   CHIEF COMPLAINT: Bloody stool yesterday.   HISTORY OF PRESENT ILLNESS: 74 year old Caucasian male with a history of hypertension, diabetes, coronary artery disease, who presented to the ED with bloody stool yesterday. The patient is alert, awake, and oriented. According to the patient, the patient's hemoglobin was normal last year but it decreased to 9.2 this April, so esophagogastroduodenoscopy and colonoscopy was scheduled on last Monday. During the procedures, the patient underwent polypectomy. Pt said Plavix was not stoppped. The patient was fine until yesterday. He had melena and bloody stool. Last night he felt dizzy and weak, so he came to the ED for further evaluation. The patient's blood pressure was 74/50. He was treated with normal saline bolus. His hemoglobin decreased to 7.8. Dr. Ulice Brilliant consulted gastroenterology, Dr. Dionne Milo. He suggests PRBC and platelet transfusion, hold Plavix and monitor vital signs. The patient will be admitted to Critical Care Unit.   PAST MEDICAL HISTORY:  1. Hypertension.  2. Diabetes.  3. Coronary artery disease.  4. Gastroesophageal reflux disease.  5. Anemia. 6. Osteoarthritis.   SOCIAL HISTORY: No smoking, drinking or illicit drugs.   PAST SURGICAL HISTORY:  1. Bilateral cataract removal. 2. Stent placement for coronary artery disease.   FAMILY HISTORY: Hypertension and diabetes.   MEDICATIONS:  1. Aspirin 325 mg p.o. daily.  2. Calcium 600 + D three caps daily.  3. Colace 100 mg p.o. daily.  4. Glucosamine chondroitin 1,500 mg p.o. daily.  5. Hydrocodone/APAP takes 5/500 milligrams p.r.n. up to four per day.  6. Lisinopril 20 mg 0.5 tablets p.o. in the morning.  7. MiraLAX oral powder 1 p.o. 2 to 3 times weekly.  8. Multivitamin 1 tablet p.o. in the morning.   9. Plavix 75 mg p.o. daily.  10. Zocor 20 mg p.o. daily in the evening.  REVIEW OF SYSTEMS: CONSTITUTIONAL: The patient denies any fever or chills but has dizziness. No weakness. EYES: No double vision or blurred vision. ENT: No dysphagia or epistaxis. No postnasal drip. No slurred speech. RESPIRATORY: No cough, sputum, shortness of breath, or hemoptysis. CARDIOVASCULAR: No chest pain, palpitation, orthopnea, or nocturnal dyspnea. No leg edema. GASTROINTESTINAL: No abdominal pain, nausea, vomiting, or diarrhea, but has melena and bloody stool. GENITOURINARY: No dysuria, hematuria, or incontinence. SKIN: Has bruises now on and off since the patient is taking Plavix, but no jaundice or rash. ENDOCRINE: No polyuria or polydipsia. HEMATOLOGY: Positive for anemia and easy bruising. NEUROLOGY: No syncope, loss of consciousness or seizure.   PHYSICAL EXAMINATION:  VITAL SIGNS: Temperature 97.5, blood pressure 104/62, pulse 67, respirations 12, oxygen saturation 99% on room air.   GENERAL: The patient is alert, awake, oriented in no acute distress.   HEENT: Pupils round, equal, and reactive to light and accommodation. Moist oral mucosa. Clear oropharynx.   NECK: Supple. No JVD or carotid bruit. No lymphadenopathy. No thyromegaly.   CARDIOVASCULAR: S1, S2, regular rate and rhythm. No murmurs or gallops.   PULMONARY: Bilateral air entry. No wheezing or rales.   ABDOMEN: Soft and obese. Mild tenderness in the left lower quadrant. No rigidity, no rebound. Bowel sounds present. No organomegaly.   EXTREMITIES: No edema, clubbing, or cyanosis. No calf tenderness. Strong bilateral pedal pulses.   SKIN: No rash or jaundice. No bruises.   NEUROLOGIC: Alert and oriented x3. No focal deficit. Power  5/5. Sensation intact. Deep tendon reflexes 2+.   LABORATORY, DIAGNOSTIC AND RADIOLOGICAL DATA: PH 7.29, pCO2 43, WBC 10, hemoglobin 7.8, platelets 233. Glucose 171, BUN 34, creatinine 1.1, sodium 139, potassium  4.4, chloride 109, bicarbonate 23. Troponin 0.03. EKG showed normal sinus rhythm at 65 beats per minute. There is a left axis deviation, left bundle branch block.   IMPRESSION:  1. Gastrointestinal bleeding, possibly due to polypectomy.  2. Anemia.  3. Hypotension.  4. Dehydration.  5. Coronary artery disease.  6. Diabetes.  7. History of hypertension.   PLAN OF TREATMENT:  1. The patient will be admitted to Critical Care Unit.  2. We will hold aspirin, Plavix and lisinopril.  3. We will keep n.p.o. except meds and the patient was given normal saline bolus. We will continue normal saline at 150 mL/h if the patient's blood pressure is stable. 4. For gastrointestinal bleeding and anemia, we will start Protonix and we will give PRBC and platelet transfusion. According to Dr. Dionne Milo, the patient needs bleeding scan now. If the patient still has active bleeding, the patient may need a vascular surgery consult.  5. Follow up CBC, BMP.  6. TED for deep vein thrombosis prophylaxis.  7. I discussed the patient's situation with the patient, the patient's wife and Dr. Dionne Milo.      TIME SPENT: About 65 minutes.  ____________________________ Demetrios Loll, MD qc:ap D: 08/17/2011 16:54:44 ET T: 08/18/2011 07:48:53 ET JOB#: [id number]  cc: Demetrios Loll, MD, <Dictator> Demetrios Loll MD ELECTRONICALLY SIGNED 08/18/2011 16:32

## 2014-06-12 NOTE — Consult Note (Signed)
Brief Consult Note: Diagnosis: Hematochezia.   Patient was seen by consultant.   Consult note dictated.   Discussed with Attending MD.   Comments: Patient with anemia and a hemoglobin of 9.2 last week. He underwent an EGD and a Colonoscopy 5 days ago. According to the patient, EGD was unremarkable but colonoscopy showed a polyp which was removed as well as diverticulosis. His Plavix was not held before or after the procedure. This morning he started to pass dark blood per rectum and had several episodes before presenting to ER. In ER he was orthostatic with SBP as low as 70. Hemoglobin is now 7.8. He is currently receiving PRBC with some improvement in hsi BP. No BM in last 1-2 hours. No upper GI symptoms.  Impression: Post polypectomy vs diverticular bleeding with significant blood loss and hemodynamic instability.  Recommendations: Stop Plavix and ASA. Transfuse 2 units PRBC and follow H and H as patient will likely need more transfusions. Platelet transfusion due to suspected platelet dysfunction due to constatnt use of ASA and Plavix. Bleeding scan. If patient continues to show signs of active bleeding especially with hemodynamic instability, please consult vascular surgery as a colonoscopy will be diffucult to perform and be of of little therapeutic value. Above recommendations were discussed with the ER physician as well as Dr. Bridgett Larsson. Wil follow. Thanks.  Electronic Signatures: Jill Side (MD)  (Signed 29-Jun-13 17:02)  Authored: Brief Consult Note   Last Updated: 29-Jun-13 17:02 by Jill Side (MD)

## 2014-06-12 NOTE — Discharge Summary (Signed)
PATIENT NAME:  Kent Porter, Kent Porter MR#:  [id number] DATE OF BIRTH:  [birth year]  DATE OF ADMISSION:  08/17/2011 DATE OF DISCHARGE:  08/19/2011  DIAGNOSES:  1. Acute lower gastrointestinal bleed. 2. Acute blood loss anemia. 3. Hypertension. 4. Diabetes. 5. Hyperlipidemia. 6. History of coronary artery disease status post stent.   DISPOSITION: Patient is being discharged home.   FOLLOW UP: Follow up with primary care physician and Dr. Dionne Milo in 1 to 2 weeks after discharge.   DIET: Low sodium, 1800 calorie ADA diet.   ACTIVITY: As tolerated.   NOTE: Patient has been advised to not take aspirin for two weeks. If he has no further bleeding he may consult with his physician and may resume aspirin 81 mg daily. He will discontinue using Plavix.   DISCHARGE MEDICATIONS:  1. Omeprazole 20 mg daily.  2. Cymbalta daily. 3. Lisinopril 5 mg daily. Hold for systolic blood pressure less than 100. 4. Calcium 600 plus vitamin D t.i.d.  5. Simvastatin 20 mg daily.   CONSULTATION: GI consultation with Dr. Dionne Milo.   LABORATORY, DIAGNOSTIC AND RADIOLOGICAL DATA: Bleeding scan negative for any acute bleeding. Hemoglobin 7.8 on admission, dropped down to 7.5, 8.3 by the time of discharge. Normal white count and platelet count. VLDL 19, LDL 41, cholesterol 89, triglycerides 93, HDL 29. Hemoglobin A1c 5.2. Essentially normal hepatic panel. Cardiac enzymes negative. Glucose 131 to 100.   HOSPITAL COURSE: Patient is a 74 year old male with past medical history of diabetes diet controlled, hypertension, hyperlipidemia, coronary artery disease status post stent. Patient underwent a recent EGD and colonoscopy for anemia. His hemoglobin was found to be 9.2 in May 2013. He underwent the endoscopy and colonoscopy without holding his aspirin and Plavix before or after the procedure. During the colonoscopy he was found to have a polyp and underwent polypectomy. Subsequently he presented with anemia, weakness, dizziness,  hypertension, and lower GI bleed. He was admitted with a diagnosis of lower GI bleed which was felt to be due to Plavix since the antiplatelet medication was not held either before or after the colonoscopy. Patient underwent a bleeding scan which did not show any bleeding. He had acute blood loss anemia and underwent transfusion of 2 units of blood and one bag of platelets. His hemoglobin went up to 8.3. Following admission he had only one episode of bloody bowel movement which showed old blood. There was no evidence of any further acute lower GI bleeding. Patient was seen by Dr. Dionne Milo who recommended that patient be discharged home. He is to discontinue taking the Plavix. He has to hold his aspirin for two more weeks following which if he has no further bleeding he may resume baby aspirin. Patient also wanted to have video capsule endoscopy Dr. Michaelle Copas office will contact the patient and set it up for the next week. Initially patient was hypotensive and was aggressively fluid resuscitated. He has been normotensive thereafter. He has diet controlled diabetes. His hemoglobin A1c is 5.2. He will continue an ADA diet. His LDL is at goal. He has history of coronary artery disease and stent. He remained chest pain free throughout the hospitalization. He is being discharged home in stable condition.   TIME SPENT: 45 minutes.   ____________________________ Cherre Huger, MD sp:cms D: 08/19/2011 16:33:43 ET T: 08/20/2011 12:09:03 ET JOB#: [id number]  cc: Cherre Huger, MD, <Dictator> Cherre Huger MD ELECTRONICALLY SIGNED 08/21/2011 7:08

## 2014-06-12 NOTE — Consult Note (Signed)
Chief Complaint:   Subjective/Chief Complaint No further bleeding or BM.   VITAL SIGNS/ANCILLARY NOTES: **Vital Signs.:   30-Jun-13 10:00   Pulse Pulse 52   Respirations Respirations 14   Systolic BP Systolic BP 712   Diastolic BP (mmHg) Diastolic BP (mmHg) 32   Mean BP 58   Pulse Ox % Pulse Ox % 96   Pulse Ox Activity Level  At rest   Oxygen Delivery Room Air/ 21 %   Pulse Ox Heart Rate 52   Brief Assessment:   Additional Physical Exam Awake and alert. No distress. Appears well.   Lab Results: Routine Chem:  30-Jun-13 06:12    Glucose, Serum  110   BUN  28   Creatinine (comp) 1.01   Sodium, Serum 142   Potassium, Serum 4.0   Chloride, Serum  109   CO2, Serum 25   Calcium (Total), Serum  7.5   Anion Gap 8   Osmolality (calc) 289   eGFR (African American) >60   eGFR (Non-African American) >60 (eGFR values <48mL/min/1.73 m2 may be an indication of chronic kidney disease (CKD). Calculated eGFR is useful in patients with stable renal function. The eGFR calculation will not be reliable in acutely ill patients when serum creatinine is changing rapidly. It is not useful in  patients on dialysis. The eGFR calculation may not be applicable to patients at the low and high extremes of body sizes, pregnant women, and vegetarians.)   Magnesium, Serum 1.9 (1.8-2.4 THERAPEUTIC RANGE: 4-7 mg/dL TOXIC: > 10 mg/dL  -----------------------)   Hemoglobin A1c ([facility]) 5.2 (The American Diabetes Association recommends that a primary goal of therapy should be <7% and that physicians should reevaluate the treatment regimen in patients with HbA1c values consistently >8%.)   Cholesterol, Serum 89   Triglycerides, Serum 93   HDL ([facility]HOU[facility])  29   VLDL Cholesterol Calculated 19   LDL Cholesterol Calculated 41 (Result(s) reported on 18 Aug 2011 at 07:20AM.)  Routine Hem:  30-Jun-13 06:12    WBC (CBC) 6.2   RBC (CBC)  2.65   Hemoglobin (CBC)  7.5   Hematocrit (CBC)  22.8   Platelet Count  (CBC) 193   MCV 86   MCH 28.4   MCHC 33.1   RDW  20.4   Neutrophil % 62.6   Lymphocyte % 24.2   Monocyte % 11.8   Eosinophil % 0.9   Basophil % 0.5   Neutrophil # 3.9   Lymphocyte # 1.5   Monocyte # 0.7   Eosinophil # 0.1   Basophil # 0.0 (Result(s) reported on 18 Aug 2011 at 06:56AM.)   Assessment/Plan:  Assessment/Plan:   Assessment Hematochezia, resolved. Mostr likely post polypectomy vs diverticular bleed. Severe anemia.    Plan Clear liquid diet. Transfuse 1 unit PRBC. Follow H and H. May transfer out of CCU. Discussed with Dr. Karsten Fells.   Electronic Signatures: Jill Side (MD)  (Signed 30-Jun-13 11:13)  Authored: Chief Complaint, VITAL SIGNS/ANCILLARY NOTES, Brief Assessment, Lab Results, Assessment/Plan   Last Updated: 30-Jun-13 11:13 by Jill Side (MD)

## 2014-06-12 NOTE — Consult Note (Signed)
PATIENT NAME:  Kent Porter, Kent Porter MR#:  [id number] DATE OF BIRTH:  [birth year]  DATE OF CONSULTATION:  08/17/2011  REFERRING PHYSICIAN:  Demetrios Loll, MD CONSULTING PHYSICIAN:  Jill Side, MD  REASON FOR CONSULTATION: Hematochezia, hemodynamic instability.   HISTORY OF PRESENT ILLNESS: This is a 74 year old Caucasian male who was apparently found to be anemic and was symptomatic from his anemia. Five days ago the patient underwent a colonoscopy as well as an EGD at another facility. According to the patient, the EGD was unremarkable, but the colonoscopy showed diverticulosis and a polyp which was removed. Apparently the patient has been on Plavix for three years due to cardiac stents and it was decided by his physician not to stop the Plavix before or after the procedure. The patient continued to feel weak and dizzy as he has been feeling due to his chronic symptomatic anemia, but this morning the patient started to pass dark red blood per rectum. According to him, he has had at least three large bloody bowel movements at home. He felt even weaker. He came to the emergency room where while going to commode he almost passed out. His blood pressure dropped into the 85I systolic. The patient's hemoglobin in the emergency room was low at around 7.8. I was called by the emergency room physician. The ER physician was instructed to give him 2 units of packed RBCs and a unit of platelets due to him being on Plavix. Admission to the Intensive Care Unit by the hospitalist service was recommended as well as a bleeding scan to further localize the site of the bleeding. The patient was evaluated in the emergency room. He has been receiving, I believe, his first or second unit of packed RBCs. His supine blood pressure is better with a systolic blood pressure of about 114. His heart rate is in the 70s. He does not appear to be in shock, although according to the nurse while going to the bathroom earlier when his blood pressure  dropped into 70s, he did become very pale. The patient's last bowel movement was about an hour to two hours ago, according to him. He denies any abdominal pain, nausea, vomiting, or hematemesis.   PAST MEDICAL HISTORY:  1. Coronary artery disease for which he underwent stenting about three years ago. 2. Diabetes. 3. Hypertension.   ALLERGIES: None reported.   HOME MEDICATIONS: I do not have a complete list of his home medications, but as mentioned above, he has been on aspirin and Plavix. The patient is also on simvastatin as well as an antihypertensive at home.   SOCIAL HISTORY/FAMILY HISTORY: Quite unremarkable.   REVIEW OF SYSTEMS: Negative except for what is mentioned in the History of Present Illness.   PHYSICAL EXAMINATION:   GENERAL: Fairly well built male. He does not appear to be in any acute distress. He does appear to be somewhat weak and does appear to be somewhat pale. No jaundice was noted.   VITALS: Blood pressure, as mentioned above, is about 114/50 at this point, and the heart rate is in the 70s.   NECK: Neck veins are flat.   LUNGS: Grossly clear to auscultation bilaterally with fair air entry.   CARDIOVASCULAR: Regular rate and rhythm. No gallops or murmur.   ABDOMEN: Hyperactive bowel sounds. Abdomen is otherwise quite soft and benign, nontender and nondistended. No rebound or guarding was noted.   NEUROLOGIC: Examination appears to be unremarkable.   LABORATORY DATA: White cell count 10, hemoglobin 7.8, hematocrit 24.7, and  platelet count 233. Troponin is normal. Liver enzymes are fairly unremarkable as well as his electrolytes. BUN is 34 and creatinine is 1.21.   ASSESSMENT AND PLAN: The patient is with probably postpolypectomy versus diverticular bleeding with significant blood loss and hemodynamic instability. There appears to be further worsening of his underlying anemia. Upper GI bleeding from a duodenal ulcer is possible but very unlikely as apparently  esophagogastroduodenoscopy done just about a few days ago did not show any significant lesions in the upper gastrointestinal tract. I would recommend stopping his Plavix and aspirin, transfuse 2 units of packed RBCs and follow hemoglobin and hematocrit as the patient will likely need more transfusions, platelet transfusion due to suspected platelet dysfunction secondary to constant use of aspirin and Plavix, and obtain a bleeding scan. If the patient continues to show signs of active bleeding, especially with hemodynamic instability, please consult vascular surgery as colonoscopy would be difficult to perform and be of little therapeutic value in that particular scenario. A colonoscopy can be performed if the patient is otherwise stable and there is enough time to prep him. A colonoscopy with further cauterization or clipping of the area may induce more bleeding, especially since he has been on Plavix until her this morning. Those recommendations were discussed with Dr. Bridgett Larsson as well as the ER physician and they are in full agreement. We will continue to follow him closely and make further recommendations as appropriate. These recommendations and possible diagnosis, treatment options, and prognosis were discussed with the patient in detail as well and he is in full understanding as well. Again, we will follow closely.  ____________________________ Jill Side, MD si:slb D: 08/17/2011 17:10:08 ET T: 08/18/2011 11:01:21 ET JOB#: [id number]  cc: Jill Side, MD, <Dictator> Jill Side MD ELECTRONICALLY SIGNED 08/18/2011 13:46

## 2016-06-25 ENCOUNTER — Encounter: Payer: Self-pay | Admitting: Internal Medicine
